# Patient Record
Sex: Female | Born: 1985 | Race: White | Hispanic: Yes | Marital: Single
Health system: Southern US, Community
[De-identification: ages and names within clinical notes are randomized; demographics above are authoritative.]

---

## 2006-08-02 ENCOUNTER — Ambulatory Visit: Payer: Self-pay | Admitting: Family Medicine

## 2006-08-09 ENCOUNTER — Ambulatory Visit: Payer: Self-pay | Admitting: Family Medicine

## 2006-08-09 ENCOUNTER — Ambulatory Visit (HOSPITAL_COMMUNITY): Admission: RE | Admit: 2006-08-09 | Discharge: 2006-08-09 | Payer: Self-pay | Admitting: Family Medicine

## 2006-08-16 ENCOUNTER — Ambulatory Visit: Payer: Self-pay | Admitting: Family Medicine

## 2006-08-23 ENCOUNTER — Ambulatory Visit: Payer: Self-pay | Admitting: Family Medicine

## 2006-08-30 ENCOUNTER — Ambulatory Visit: Payer: Self-pay | Admitting: Obstetrics & Gynecology

## 2006-09-06 ENCOUNTER — Ambulatory Visit: Payer: Self-pay | Admitting: Gynecology

## 2006-09-13 ENCOUNTER — Ambulatory Visit: Payer: Self-pay | Admitting: *Deleted

## 2006-09-20 ENCOUNTER — Ambulatory Visit: Payer: Self-pay | Admitting: Obstetrics and Gynecology

## 2006-09-27 ENCOUNTER — Ambulatory Visit: Payer: Self-pay | Admitting: Obstetrics & Gynecology

## 2006-10-04 ENCOUNTER — Ambulatory Visit: Payer: Self-pay | Admitting: *Deleted

## 2006-10-11 ENCOUNTER — Ambulatory Visit: Payer: Self-pay | Admitting: Obstetrics and Gynecology

## 2006-10-18 ENCOUNTER — Ambulatory Visit: Payer: Self-pay | Admitting: Family Medicine

## 2006-10-25 ENCOUNTER — Ambulatory Visit: Payer: Self-pay | Admitting: Family Medicine

## 2006-11-01 ENCOUNTER — Ambulatory Visit: Payer: Self-pay | Admitting: Obstetrics and Gynecology

## 2006-11-08 ENCOUNTER — Ambulatory Visit: Payer: Self-pay | Admitting: Gynecology

## 2006-11-15 ENCOUNTER — Ambulatory Visit: Payer: Self-pay | Admitting: Family Medicine

## 2006-11-22 ENCOUNTER — Ambulatory Visit: Payer: Self-pay | Admitting: Family Medicine

## 2006-11-29 ENCOUNTER — Ambulatory Visit: Payer: Self-pay | Admitting: Obstetrics and Gynecology

## 2006-12-06 ENCOUNTER — Ambulatory Visit: Payer: Self-pay | Admitting: Family Medicine

## 2006-12-13 ENCOUNTER — Ambulatory Visit: Payer: Self-pay | Admitting: Obstetrics & Gynecology

## 2006-12-20 ENCOUNTER — Inpatient Hospital Stay (HOSPITAL_COMMUNITY): Admission: AD | Admit: 2006-12-20 | Discharge: 2006-12-22 | Payer: Self-pay | Admitting: Obstetrics and Gynecology

## 2006-12-20 ENCOUNTER — Ambulatory Visit: Payer: Self-pay | Admitting: Obstetrics and Gynecology

## 2006-12-20 ENCOUNTER — Ambulatory Visit: Payer: Self-pay | Admitting: *Deleted

## 2011-02-03 LAB — POCT URINALYSIS DIP (DEVICE)
Bilirubin Urine: NEGATIVE
Ketones, ur: NEGATIVE
Nitrite: NEGATIVE
Protein, ur: NEGATIVE
pH: 7

## 2011-02-03 LAB — CBC
HCT: 18.2 — ABNORMAL LOW
HCT: 35.2 — ABNORMAL LOW
Hemoglobin: 12.1
Hemoglobin: 6.3 — CL
MCHC: 34.4
MCHC: 35.2
MCHC: 35.2
MCV: 88.9
MCV: 89.1
MCV: 89.1
Platelets: 240
Platelets: 244
Platelets: 264
Platelets: 295
Platelets: 306
RBC: 2.04 — ABNORMAL LOW
RDW: 14.1 — ABNORMAL HIGH
RDW: 14.6 — ABNORMAL HIGH
WBC: 11.3 — ABNORMAL HIGH
WBC: 13.6 — ABNORMAL HIGH

## 2011-02-03 LAB — CROSSMATCH: ABO/RH(D): O POS

## 2011-02-06 LAB — POCT URINALYSIS DIP (DEVICE)
Bilirubin Urine: NEGATIVE
Bilirubin Urine: NEGATIVE
Bilirubin Urine: NEGATIVE
Bilirubin Urine: NEGATIVE
Glucose, UA: NEGATIVE
Ketones, ur: NEGATIVE
Ketones, ur: NEGATIVE
Ketones, ur: NEGATIVE
Ketones, ur: NEGATIVE
Operator id: 159681
Operator id: 200901
pH: 7
pH: 6.5

## 2011-02-07 LAB — POCT URINALYSIS DIP (DEVICE)
Glucose, UA: NEGATIVE
Glucose, UA: NEGATIVE
Ketones, ur: NEGATIVE
Ketones, ur: NEGATIVE
Operator id: 159681
Specific Gravity, Urine: 1.015
Specific Gravity, Urine: 1.015
Urobilinogen, UA: 0.2

## 2011-02-08 LAB — POCT URINALYSIS DIP (DEVICE)
Glucose, UA: NEGATIVE
Ketones, ur: NEGATIVE
Operator id: 159681
Specific Gravity, Urine: 1.015
Urobilinogen, UA: 0.2

## 2011-02-09 LAB — POCT URINALYSIS DIP (DEVICE)
Glucose, UA: NEGATIVE
Glucose, UA: NEGATIVE
Nitrite: NEGATIVE
Operator id: 134861
Operator id: 134861
Protein, ur: NEGATIVE
Specific Gravity, Urine: 1.01
Specific Gravity, Urine: 1.01
Urobilinogen, UA: 0.2
Urobilinogen, UA: 0.2

## 2022-08-11 ENCOUNTER — Emergency Department (HOSPITAL_COMMUNITY): Payer: Self-pay

## 2022-08-11 ENCOUNTER — Other Ambulatory Visit: Payer: Self-pay

## 2022-08-11 ENCOUNTER — Encounter (HOSPITAL_COMMUNITY): Payer: Self-pay

## 2022-08-11 ENCOUNTER — Emergency Department (HOSPITAL_COMMUNITY)
Admission: EM | Admit: 2022-08-11 | Discharge: 2022-08-12 | Disposition: A | Discharge status: Home / Self Care | Payer: Self-pay | Attending: Emergency Medicine | Admitting: Emergency Medicine

## 2022-08-11 DIAGNOSIS — M79604 Pain in right leg: Secondary | ICD-10-CM | POA: Insufficient documentation

## 2022-08-11 DIAGNOSIS — R0789 Other chest pain: Secondary | ICD-10-CM

## 2022-08-11 DIAGNOSIS — I4510 Unspecified right bundle-branch block: Secondary | ICD-10-CM | POA: Insufficient documentation

## 2022-08-11 LAB — CBC WITH DIFFERENTIAL/PLATELET
Abs Immature Granulocytes: 0.01 10*3/uL (ref 0.00–0.07)
Basophils Absolute: 0 10*3/uL (ref 0.0–0.1)
Basophils Relative: 0 %
Eosinophils Absolute: 0.2 10*3/uL (ref 0.0–0.5)
Eosinophils Relative: 4 %
HCT: 36.9 % (ref 36.0–46.0)
Hemoglobin: 12.2 g/dL (ref 12.0–15.0)
Immature Granulocytes: 0 %
Lymphocytes Relative: 41 %
Lymphs Abs: 2 10*3/uL (ref 0.7–4.0)
MCH: 28.5 pg (ref 26.0–34.0)
MCHC: 33.1 g/dL (ref 30.0–36.0)
MCV: 86.2 fL (ref 80.0–100.0)
Monocytes Absolute: 0.5 10*3/uL (ref 0.1–1.0)
Monocytes Relative: 9 %
Neutro Abs: 2.3 10*3/uL (ref 1.7–7.7)
Neutrophils Relative %: 46 %
Platelets: 358 10*3/uL (ref 150–400)
RBC: 4.28 MIL/uL (ref 3.87–5.11)
RDW: 13.2 % (ref 11.5–15.5)
WBC: 5 10*3/uL (ref 4.0–10.5)
nRBC: 0 % (ref 0.0–0.2)

## 2022-08-11 LAB — COMPREHENSIVE METABOLIC PANEL
ALT: 24 U/L (ref 0–44)
AST: 21 U/L (ref 15–41)
Albumin: 3.9 g/dL (ref 3.5–5.0)
Alkaline Phosphatase: 65 U/L (ref 38–126)
Anion gap: 8 (ref 5–15)
BUN: 12 mg/dL (ref 6–20)
CO2: 23 mmol/L (ref 22–32)
Calcium: 8.3 mg/dL — ABNORMAL LOW (ref 8.9–10.3)
Chloride: 107 mmol/L (ref 98–111)
Creatinine, Ser: 0.64 mg/dL (ref 0.44–1.00)
GFR, Estimated: >60 mL/min (ref >60)
Glucose, Bld: 104 mg/dL — ABNORMAL HIGH (ref 70–99)
Potassium: 3.9 mmol/L (ref 3.5–5.1)
Sodium: 138 mmol/L (ref 135–145)
Total Bilirubin: 0.3 mg/dL (ref 0.3–1.2)
Total Protein: 7.2 g/dL (ref 6.5–8.1)

## 2022-08-11 LAB — I-STAT BETA HCG BLOOD, ED (MC, WL, AP ONLY): I-stat hCG, quantitative: <5 m[IU]/mL (ref <5)

## 2022-08-11 LAB — TROPONIN I (HIGH SENSITIVITY)
Troponin I (High Sensitivity): 3 ng/L (ref <18)
Troponin I (High Sensitivity): 3 ng/L (ref <18)

## 2022-08-11 MED ORDER — MORPHINE SULFATE (PF) 4 MG/ML IV SOLN
4.0000 mg | Freq: Once | INTRAVENOUS | Status: AC
  Administered 2022-08-11: 4 mg via INTRAVENOUS
  Filled 2022-08-11: qty 1

## 2022-08-11 MED ORDER — SODIUM CHLORIDE 0.9 % IV BOLUS
1000.0000 mL | Freq: Once | INTRAVENOUS | Status: AC
  Administered 2022-08-11: 1000 mL via INTRAVENOUS

## 2022-08-11 MED ORDER — IBUPROFEN 600 MG PO TABS: 600.0000 mg | ORAL_TABLET | Freq: Four times a day (QID) | ORAL | 0 refills | Status: AC | PRN

## 2022-08-11 MED ORDER — ONDANSETRON HCL 4 MG/2ML IJ SOLN
4.0000 mg | Freq: Once | INTRAMUSCULAR | Status: AC
  Administered 2022-08-11: 4 mg via INTRAVENOUS
  Filled 2022-08-11: qty 2

## 2022-08-11 MED ORDER — IOHEXOL 350 MG/ML SOLN
75.0000 mL | Freq: Once | INTRAVENOUS | Status: AC | PRN
  Administered 2022-08-11: 75 mL via INTRAVENOUS

## 2022-08-11 MED ORDER — HYDROCODONE-ACETAMINOPHEN 5-325 MG PO TABS: 1.0000 | ORAL_TABLET | ORAL | 0 refills | Status: AC | PRN

## 2022-08-11 MED ORDER — KETOROLAC TROMETHAMINE 30 MG/ML IJ SOLN
30.0000 mg | Freq: Once | INTRAMUSCULAR | Status: AC
  Administered 2022-08-11: 30 mg via INTRAVENOUS
  Filled 2022-08-11: qty 1

## 2022-08-11 NOTE — ED Provider Notes (Signed)
[facility] AT [facility] Provider Note   CSN: [id number] Arrival date & time: 08/11/22  1441     History  Chief Complaint  Patient presents with   Dizziness   Chest Pain    Courtney Price is a 37 y.o. female.  Pt is a 37 yo female with no pmhx.  Pt said she's had cp since Monday, 4/15.  It is associated with sob.  She has had some right leg pain.  No f/c.  No cough.  Due to language barrier, an interpreter was present during the history-taking and subsequent discussion (and for part of the physical exam) with this patient.        Home Medications Prior to Admission medications   Medication Sig Start Date End Date Taking? Authorizing Provider  HYDROcodone-acetaminophen (NORCO/VICODIN) 5-325 MG tablet Take 1 tablet by mouth every 4 (four) hours as needed. 08/11/22  Yes Jacalyn Lefevre, MD  ibuprofen (ADVIL) 600 MG tablet Take 1 tablet (600 mg total) by mouth every 6 (six) hours as needed. 08/11/22  Yes Jacalyn Lefevre, MD      Allergies    Patient has no allergy information on record.    Review of Systems   Review of Systems  Cardiovascular:  Positive for chest pain.  All other systems reviewed and are negative.   Physical Exam Updated Vital Signs BP 127/77 (BP Location: Left Arm)   Pulse (!) 56   Temp 98.3 F (36.8 C) (Oral)   Resp 16   Wt 85.7 kg   SpO2 99%  Physical Exam Vitals and nursing note reviewed.  Constitutional:      Appearance: She is well-developed.  HENT:     Head: Normocephalic and atraumatic.  Eyes:     Extraocular Movements: Extraocular movements intact.     Pupils: Pupils are equal, round, and reactive to light.  Cardiovascular:     Rate and Rhythm: Normal rate and regular rhythm.     Heart sounds: Normal heart sounds.  Pulmonary:     Effort: Pulmonary effort is normal.     Breath sounds: Normal breath sounds.  Chest:    Abdominal:     General: Bowel sounds are normal.     Palpations: Abdomen is  soft.  Musculoskeletal:        General: Normal range of motion.     Cervical back: Normal range of motion and neck supple.  Skin:    Capillary Refill: Capillary refill takes less than 2 seconds.  Neurological:     General: No focal deficit present.     Mental Status: She is alert and oriented to person, place, and time.  Psychiatric:        Mood and Affect: Mood normal.        Behavior: Behavior normal.     ED Results / Procedures / Treatments   Labs (all labs ordered are listed, but only abnormal results are displayed) Labs Reviewed  COMPREHENSIVE METABOLIC PANEL - Abnormal; Notable for the following components:      Result Value   Glucose, Bld 104 (*)    Calcium 8.3 (*)    All other components within normal limits  CBC WITH DIFFERENTIAL/PLATELET  URINALYSIS, ROUTINE W REFLEX MICROSCOPIC  I-STAT BETA HCG BLOOD, ED ([facility], [facility], [facility] ONLY)  TROPONIN I (HIGH SENSITIVITY)  TROPONIN I (HIGH SENSITIVITY)    EKG EKG Interpretation  Date/Time:  Friday August 11 2022 16:20:17 EDT Ventricular Rate:  58 PR Interval:  162 QRS Duration:  160 QT Interval:  462 QTC Calculation: 453 R Axis:   28 Text Interpretation: Sinus bradycardia Right bundle branch block Abnormal ECG No previous ECGs available Confirmed by Jacalyn Lefevre [phone]) on 08/11/2022 7:59:35 PM  Radiology CT Angio Chest PE W and/or Wo Contrast  Result Date: 08/11/2022 CLINICAL DATA:  Chest pain. EXAM: CT ANGIOGRAPHY CHEST WITH CONTRAST TECHNIQUE: Multidetector CT imaging of the chest was performed using the standard protocol during bolus administration of intravenous contrast. Multiplanar CT image reconstructions and MIPs were obtained to evaluate the vascular anatomy. RADIATION DOSE REDUCTION: This exam was performed according to the departmental dose-optimization program which includes automated exposure control, adjustment of the mA and/or kV according to patient size and/or use of iterative reconstruction technique. CONTRAST:   75mL OMNIPAQUE IOHEXOL 350 MG/ML SOLN COMPARISON:  None Available. FINDINGS: Cardiovascular: Satisfactory opacification of the pulmonary arteries to the segmental level. No evidence of pulmonary embolism. Normal heart size. No pericardial effusion. Mediastinum/Nodes: A 1.9 cm x 1.4 cm anterior mediastinal lymph node is noted. Thyroid gland, trachea, and esophagus demonstrate no significant findings. Lungs/Pleura: Lungs are clear. No pleural effusion or pneumothorax. Upper Abdomen: No acute abnormality. Musculoskeletal: A compression deformity of indeterminate age is seen involving the posterior aspect of the T6 vertebral body Review of the MIP images confirms the above findings. IMPRESSION: 1. No evidence of pulmonary embolism or other acute intrathoracic process. 2. Compression deformity of indeterminate age involving the posterior aspect of the T6 vertebral body. Correlation with point tenderness is recommended. Electronically Signed   By: Aram Candela M.D.   On: 08/11/2022 22:50   DG Chest 2 View  Result Date: 08/11/2022 CLINICAL DATA:  Chest pain EXAM: CHEST - 2 VIEW COMPARISON:  None Available. FINDINGS: The heart size and mediastinal contours are within normal limits. Both lungs are clear. The visualized skeletal structures are unremarkable. IMPRESSION: No active cardiopulmonary disease. Electronically Signed   By: Charlett Nose M.D.   On: 08/11/2022 17:10    Procedures Procedures    Medications Ordered in ED Medications  ketorolac (TORADOL) 30 MG/ML injection 30 mg (has no administration in time range)  morphine (PF) 4 MG/ML injection 4 mg (4 mg Intravenous Given 08/11/22 2139)  ondansetron (ZOFRAN) injection 4 mg (4 mg Intravenous Given 08/11/22 2140)  sodium chloride 0.9 % bolus 1,000 mL (1,000 mLs Intravenous New Bag/Given 08/11/22 2140)  iohexol (OMNIPAQUE) 350 MG/ML injection 75 mL (75 mLs Intravenous Contrast Given 08/11/22 2241)    ED Course/ Medical Decision Making/ A&P                              Medical Decision Making Amount and/or Complexity of Data Reviewed Labs: ordered. Radiology: ordered.  Risk Prescription drug management.   This patient presents to the ED for concern of cp, this involves an extensive number of treatment options, and is a complaint that carries with it a high risk of complications and morbidity.  The differential diagnosis includes cardiac, pulm, gi   Co morbidities that complicate the patient evaluation  none   Additional history obtained:  Additional history obtained from epic chart review External records from outside source obtained and reviewed including friend   Lab Tests:  I Ordered, and personally interpreted labs.  The pertinent results include:  preg neg, cmp nl, cbc nl, trop nl   Imaging Studies ordered:  I ordered imaging studies including cxr and ct chest  I independently visualized and interpreted imaging  which showed  CXR:  No active cardiopulmonary disease.  CT chest: No evidence of pulmonary embolism or other acute intrathoracic  process.  2. Compression deformity of indeterminate age involving the  posterior aspect of the T6 vertebral body. Correlation with point  tenderness is recommended.   I agree with the radiologist interpretation   Cardiac Monitoring:  The patient was maintained on a cardiac monitor.  I personally viewed and interpreted the cardiac monitored which showed an underlying rhythm of: nsr   Medicines ordered and prescription drug management:  I ordered medication including morphine/zofran  for sx  Reevaluation of the patient after these medicines showed that the patient improved I have reviewed the patients home medicines and have made adjustments as needed   Test Considered:  ct   Critical Interventions:  Pain control   Problem List / ED Course:  CP   Reevaluation:  After the interventions noted above, I reevaluated the patient and found that they have  :improved   Social Determinants of Health:  Spanish speaker/no insurance/no pcp   Dispostion:  After consideration of the diagnostic results and the patients response to treatment, I feel that the patent would benefit from discharge with outpatient f/u.          Final Clinical Impression(s) / ED Diagnoses Final diagnoses:  Atypical chest pain  Right bundle branch block    Rx / DC Orders ED Discharge Orders          Ordered    HYDROcodone-acetaminophen (NORCO/VICODIN) 5-325 MG tablet  Every 4 hours PRN        08/11/22 2258    ibuprofen (ADVIL) 600 MG tablet  Every 6 hours PRN        08/11/22 2258              Jacalyn Lefevre, MD 08/11/22 2300

## 2022-08-11 NOTE — ED Triage Notes (Signed)
PT arrived to ED via POV after having chest pain since Mon 4/15. PT states that it started as pain in her upper right arm that radiates to R shoulder and R neck, and has progressed to pain midsternum. Pain rated as a 8/10, endorses pain on inspiration, SOB with exertion, and rotating of her neck. Denies HA, N/V. Aox4.

## 2022-08-11 NOTE — ED Provider Triage Note (Signed)
Emergency Medicine Provider Triage Evaluation Note  Lillionna Nabi , a 37 y.o. female  was evaluated in triage.  Pt complains of chest pain since Monday.  She states it began as pain in her upper right arm that radiates to her shoulder and neck and is now mid sternum.  She states it is worse with inspiration and she has dyspnea on exertion.  Denies headache, nausea, vomiting, fever, syncope.    Review of Systems  Positive: As above Negative: As above  Physical Exam  BP 127/78 (BP Location: Right Arm)   Pulse 63   Temp 98.7 F (37.1 C)   Resp 17   Wt 85.7 kg   SpO2 100%  Gen:   Awake, no distress   Resp:  Normal effort  MSK:   Moves extremities without difficulty  Other:    Medical Decision Making  Medically screening exam initiated at 4:35 PM.  Appropriate orders placed.  Earnestene Angello was informed that the remainder of the evaluation will be completed by another provider, this initial triage assessment does not replace that evaluation, and the importance of remaining in the ED until their evaluation is complete.     Lenard Simmer, [place] 08/11/22 1637
